# Patient Record
Sex: Female | Born: 1935 | Race: White | Hispanic: No | Smoking: Former smoker
Health system: Southern US, Community
[De-identification: ages and names within clinical notes are randomized; demographics above are authoritative.]

## PROBLEM LIST (undated history)

## (undated) DIAGNOSIS — I10 Essential (primary) hypertension: Secondary | ICD-10-CM

---

## 2006-09-03 ENCOUNTER — Encounter: Admission: RE | Admit: 2006-09-03 | Discharge: 2006-09-03 | Payer: Self-pay | Admitting: Neurosurgery

## 2006-11-25 ENCOUNTER — Encounter: Admission: RE | Admit: 2006-11-25 | Discharge: 2006-11-25 | Payer: Self-pay | Admitting: Orthopedic Surgery

## 2006-11-26 ENCOUNTER — Ambulatory Visit (HOSPITAL_BASED_OUTPATIENT_CLINIC_OR_DEPARTMENT_OTHER): Admission: RE | Admit: 2006-11-26 | Discharge: 2006-11-27 | Payer: Self-pay | Admitting: Orthopedic Surgery

## 2011-05-07 ENCOUNTER — Encounter (HOSPITAL_COMMUNITY)
Admission: RE | Admit: 2011-05-07 | Discharge: 2011-05-07 | Disposition: A | Discharge status: Home / Self Care | Payer: Medicare Other | Source: Ambulatory Visit | Attending: Neurological Surgery | Admitting: Neurological Surgery

## 2011-05-07 ENCOUNTER — Ambulatory Visit (HOSPITAL_COMMUNITY)
Admission: RE | Admit: 2011-05-07 | Discharge: 2011-05-07 | Disposition: A | Discharge status: Home / Self Care | Payer: Medicare Other | Source: Ambulatory Visit | Attending: Neurological Surgery | Admitting: Neurological Surgery

## 2011-05-07 ENCOUNTER — Other Ambulatory Visit (HOSPITAL_COMMUNITY): Payer: Self-pay | Admitting: Neurological Surgery

## 2011-05-07 DIAGNOSIS — Z01811 Encounter for preprocedural respiratory examination: Secondary | ICD-10-CM | POA: Insufficient documentation

## 2011-05-07 DIAGNOSIS — Q762 Congenital spondylolisthesis: Secondary | ICD-10-CM | POA: Insufficient documentation

## 2011-05-07 DIAGNOSIS — M4310 Spondylolisthesis, site unspecified: Secondary | ICD-10-CM

## 2011-05-07 DIAGNOSIS — Z01810 Encounter for preprocedural cardiovascular examination: Secondary | ICD-10-CM | POA: Insufficient documentation

## 2011-05-07 DIAGNOSIS — Z01812 Encounter for preprocedural laboratory examination: Secondary | ICD-10-CM | POA: Insufficient documentation

## 2011-05-07 LAB — URINALYSIS, ROUTINE W REFLEX MICROSCOPIC
Bilirubin Urine: NEGATIVE
Specific Gravity, Urine: 1.006 (ref 1.005–1.030)
pH: 6.5 (ref 5.0–8.0)

## 2011-05-07 LAB — BASIC METABOLIC PANEL
CO2: 32 mEq/L (ref 19–32)
Chloride: 102 mEq/L (ref 96–112)
Glucose, Bld: 101 mg/dL — ABNORMAL HIGH (ref 70–99)
Potassium: 5.1 mEq/L (ref 3.5–5.1)
Sodium: 140 mEq/L (ref 135–145)

## 2011-05-07 LAB — DIFFERENTIAL
Eosinophils Relative: 2 % (ref 0–5)
Lymphocytes Relative: 34 % (ref 12–46)
Monocytes Relative: 10 % (ref 3–12)
Neutro Abs: 5.2 10*3/uL (ref 1.7–7.7)
Neutrophils Relative %: 53 % (ref 43–77)

## 2011-05-07 LAB — CBC
Hemoglobin: 13.3 g/dL (ref 12.0–15.0)
MCHC: 33.7 g/dL (ref 30.0–36.0)
Platelets: 350 10*3/uL (ref 150–400)
RDW: 14.1 % (ref 11.5–15.5)
WBC: 9.8 10*3/uL (ref 4.0–10.5)

## 2011-05-07 LAB — URINE MICROSCOPIC-ADD ON

## 2011-05-07 LAB — SURGICAL PCR SCREEN: MRSA, PCR: NEGATIVE

## 2011-05-07 LAB — PROTIME-INR: INR: 1.02 (ref 0.00–1.49)

## 2011-05-07 LAB — TYPE AND SCREEN
ABO/RH(D): O POS
Antibody Screen: NEGATIVE

## 2011-05-16 ENCOUNTER — Inpatient Hospital Stay (HOSPITAL_COMMUNITY): Payer: Medicare Other

## 2011-05-16 ENCOUNTER — Inpatient Hospital Stay (HOSPITAL_COMMUNITY)
Admission: RE | Admit: 2011-05-16 | Discharge: 2011-05-18 | DRG: 460 | Disposition: A | Discharge status: Home / Self Care | Payer: Medicare Other | Source: Ambulatory Visit | Attending: Neurological Surgery | Admitting: Neurological Surgery

## 2011-05-16 DIAGNOSIS — Z87891 Personal history of nicotine dependence: Secondary | ICD-10-CM

## 2011-05-16 DIAGNOSIS — E669 Obesity, unspecified: Secondary | ICD-10-CM | POA: Diagnosis present

## 2011-05-16 DIAGNOSIS — K219 Gastro-esophageal reflux disease without esophagitis: Secondary | ICD-10-CM | POA: Diagnosis present

## 2011-05-16 DIAGNOSIS — Q762 Congenital spondylolisthesis: Secondary | ICD-10-CM

## 2011-05-16 DIAGNOSIS — I10 Essential (primary) hypertension: Secondary | ICD-10-CM | POA: Diagnosis present

## 2011-05-16 DIAGNOSIS — J4489 Other specified chronic obstructive pulmonary disease: Secondary | ICD-10-CM | POA: Diagnosis present

## 2011-05-16 DIAGNOSIS — J449 Chronic obstructive pulmonary disease, unspecified: Secondary | ICD-10-CM | POA: Diagnosis present

## 2011-05-16 DIAGNOSIS — M48061 Spinal stenosis, lumbar region without neurogenic claudication: Principal | ICD-10-CM | POA: Diagnosis present

## 2011-06-11 NOTE — Op Note (Signed)
NAMEGREGORY, DOWE NO.:  [id number]  MEDICAL RECORD NO.:  [id number]  LOCATION:  2899                         FACILITY:  [facility]  PHYSICIAN:  Tia Alert, MD     DATE OF BIRTH:  [birth year]  DATE OF PROCEDURE:  05/16/2011 DATE OF DISCHARGE:                              OPERATIVE REPORT   PREOPERATIVE DIAGNOSES: 1. Lumbar spinal stenosis. 2. Spondylolisthesis L4-5. 3. Severe facet arthropathy L4-5, L5-S1. 4. Back pain. 5. Leg pain.  POSTOPERATIVE DIAGNOSES: 1. Lumbar spinal stenosis. 2. Spondylolisthesis L4-5. 3. Severe facet arthropathy L4-5, L5-S1. 4. Back pain. 5. Leg pain.  PROCEDURES: 1. Decompressive lumbar laminectomy, hemi facetectomy, and bilateral     foraminotomies at L4-5 with undercutting of the inferior part of     the L3 lamina and partial laminectomy, and lateral recess     decompression at L3-4 for decompression of the L4 and the L5 nerve     roots to decompress severe spinal stenosis, requiring much more     work than would be required for simple exposure for a PLIF     procedure. 2. Posterior lumbar interbody fusion L4-5 utilizing 10 x 24-mm PEEK     interbody cage, packed with local autograft and morselized     allograft, and a 10-mm tangent interbody bone wedge. 3. Intertransverse arthrodesis, L4-5, L5-S1 utilizing local autograft     and morselized allograft. 4. Nonsegmental fixation of L4-5 utilizing the Globus pedicle screw     system.  SURGEON:  Tia Alert, MD.  ASSISTANT:  Donalee Citrin, MD.  ANESTHESIA:  General endotracheal.  COMPLICATIONS:  None apparent.  INDICATIONS FOR THE PROCEDURE:  Ms. Stander is a 77 year old female who presented with severe back and bilateral leg pain.  She undergone previous laminectomy at L4-5 on the right at the [facility]. She had an MRI which showed lateral recess stenosis at L3-L4 with severe central spinal stenosis at L4-5 from the ligamentum flavum, overgrowth of facet  hypertrophy, and broad-based disk bulge with a significant spondylolisthesis at that level that was mobile and flexion/extension. She also had severe facet arthropathy at L4-5, L5-S1.  I recommended decompressive laminectomies and instrumented fusion.  She understood the risks, benefits, and expected outcome and wished to proceed.  DESCRIPTION OF PROCEDURE:  The patient was taken to the operating room, and after induction of adequate generalized endotracheal anesthesia, she was rolled into prone position on chest rolls and all pressure points were padded.  Her lumbar region was prepped with DuraPrep and then draped in usual sterile fashion.  A 10 mL of local anesthesia was injected and a dorsal midline incision was made and carried down to the lumbosacral fascia.  The fascia was opened and the paraspinous musculature was taken down in subperiosteal fashion to expose L3-4, L4- 5, and L5-S1.  Intraoperative fluoroscopy confirmed my level.  I dissected out to expose the transverse processes of L4, L5, and the sacral ala.  I then removed the spinous process of L4 and performed a complete laminectomy, hemi facetectomy, and foraminotomies at L4-5 for decompression of the L4-L5 nerve roots.  I did much more work than would be  required for exposure for simple PLIF procedure in order to decompress her severe stenosis.  I did a partial laminectomy at L3-4 and undercut the lateral recess there to decompress the L4 nerve root up to the disk space level.  During the decompression, her yellow ligament was quite stuck to the edges of the dura.  I spent considerable time, teasing this away from the edges of the dura.  She had had a previous laminectomy at L4-L5 on the right.  I worked diligently through the scar tissue to expose L4-5 bilaterally.  At no time to their, proceed an unintended durotomy, but I did decompress the nerve roots distally into their respective foramina, again to address her leg  pain from her severe spinal stenosis.  I turned my attention to the nonsegmental fixation.  I localized the pedicle screw entry zones utilizing AP fluoroscopy and surface landmarks and lateral fluoroscopy.  I probed each pedicle with pedicle probe and tapped each pedicle with 5.5 tap and placed 6.5 x 45 mm pedicle screws into the L4 and the L5 pedicles bilaterally.  I decorticated the transverse processes of L4 and L5 and the sacral ala and placed a mixture of local autograft and morselized allograft out over these to perform intertransverse arthrodesis.  I then turned my attention to the PLIF.  I localized the disk space bilaterally.  I coagulated the epidural venous vasculature.  I cut these sharply and used sequential dilation to distract the disk space up to 10 mm.  I then used the rotating cutter and the cutting chisel bilaterally to prepare the endplates, performed a complete diskectomy.  The midline was prepared with Epstein curettes.  We used a 10 x 24 mm PEEK interbody cage, packed with local autograft and morselized allograft and tapped this into position on the left.  A tangent interbody bone wedge was used on the right.  The midline was packed with local autograft and morselized allograft.  We then placed lordotic rods into multiaxial screw heads of the pedicle screws and locked these in position with locking caps and anti-torque device.  We then irrigated with saline solution containing bacitracin, inspected our nerve roots once again palpated along them to assure adequate decompression, lined the dura with Gelfoam, checked our final construct with AP and lateral fluoroscopy, placed a medium Hemovac drain through a separate stab incision and closed the muscle and the fascia with 0 Vicryl, closed the subcutaneous and subcuticular tissue with 2-0 and 3-0 Vicryl, and closed the skin with Benzoin and Steri-Strips.  The drapes were removed. Sterile dressing was applied.  The  patient was awakened from general anesthesia and transferred to recovery room in stable condition.  At the end of procedure, all sponge, needle, and instrument counts were correct.     Tia Alert, MD     DSJ/MEDQ  D:  05/16/2011  T:  05/16/2011  Job:  [id number]  Electronically Signed by Marikay Alar MD on 06/11/2011 01:13:00 PM

## 2011-06-25 ENCOUNTER — Ambulatory Visit
Admission: RE | Admit: 2011-06-25 | Discharge: 2011-06-25 | Disposition: A | Discharge status: Home / Self Care | Payer: Medicare Other | Source: Ambulatory Visit | Attending: Neurological Surgery | Admitting: Neurological Surgery

## 2011-06-25 ENCOUNTER — Other Ambulatory Visit: Payer: Self-pay | Admitting: Neurological Surgery

## 2011-06-25 DIAGNOSIS — M48061 Spinal stenosis, lumbar region without neurogenic claudication: Secondary | ICD-10-CM

## 2011-06-25 DIAGNOSIS — M545 Low back pain, unspecified: Secondary | ICD-10-CM

## 2011-06-26 NOTE — Discharge Summary (Signed)
  NAME:  OTTO, CARAWAY NO.:  [id number]  MEDICAL RECORD NO.:  [id number]  LOCATION:  3011                         FACILITY:  [facility]  PHYSICIAN:  Tia Alert, MD     DATE OF BIRTH:  [birth year]  DATE OF ADMISSION:  05/16/2011 DATE OF DISCHARGE:  05/18/2011                              DISCHARGE SUMMARY   ADMITTING DIAGNOSIS:  Lumbar spondylosis with spinal stenosis and spondylolisthesis, L4-5.  PROCEDURE:  Posterior lumbar interbody fusion, L4-5.  HISTORY OF ILLNESS:  Ms. Jerez is a 77 year old female who presented with severe back pain and bilateral leg pain.  She had undergone previous laminectomy at L4-5 on the right.  [facility] which showed lateral recess stenosis at L3-4, severe spinal stenosis at L4-5 with ligamentum flavum overgrowth and facet hypertrophy.  She had spondylolisthesis at that level.  I recommended decompression instrumented fusion in hopes of improving her pain syndrome.  She understood the risks, benefits, and expected outcome and wished to proceed.  HOSPITAL COURSE:  The patient was admitted on May 16, 2011 and taken to operating room where she underwent a PLIF at L4-5.  The patient tolerated the procedure well, taken to recovery room and then to floor in stable condition.  For details of the operative procedure, please see the dictated operative note.  The patient's hospital course was routine. There were no complications.  She had physical and occupational therapy ordered.  She worked with it for 2 days.  She made great strides with them.  Her pain was well-controlled on oral pain medications.  She tolerated regular diet.  Her incision remained clean, dry, and intact. She had a Hemovac drain in place that was removed postop day #2.  She was discharged to home on that day with stable condition with plans to follow up in 2 weeks.  FINAL DIAGNOSIS:  Posterior lumbar interbody fusion,  L4-5.     Tia Alert, MD     DSJ/MEDQ  D:  06/11/2011  T:  06/11/2011  Job:  [id number]  Electronically Signed by Marikay Alar MD on 06/26/2011 08:53:41 AM

## 2011-08-27 ENCOUNTER — Ambulatory Visit
Admission: RE | Admit: 2011-08-27 | Discharge: 2011-08-27 | Disposition: A | Discharge status: Home / Self Care | Payer: Medicare Other | Source: Ambulatory Visit | Attending: Neurological Surgery | Admitting: Neurological Surgery

## 2011-08-27 ENCOUNTER — Other Ambulatory Visit: Payer: Self-pay | Admitting: Neurological Surgery

## 2011-08-27 DIAGNOSIS — M4310 Spondylolisthesis, site unspecified: Secondary | ICD-10-CM

## 2011-08-27 DIAGNOSIS — M48061 Spinal stenosis, lumbar region without neurogenic claudication: Secondary | ICD-10-CM

## 2011-08-27 DIAGNOSIS — M545 Low back pain: Secondary | ICD-10-CM

## 2013-07-23 IMAGING — RF DG LUMBAR SPINE 2-3V
1 series · 2 of 2 positions shown · non-contrast
Comparison: None.

CLINICAL DATA: 77-year-old female undergoing lumbar surgery.

LUMBAR SPINE - 2-3 VIEW

[Series 1: run · 2 of 2 slices shown]
[im 1/2]
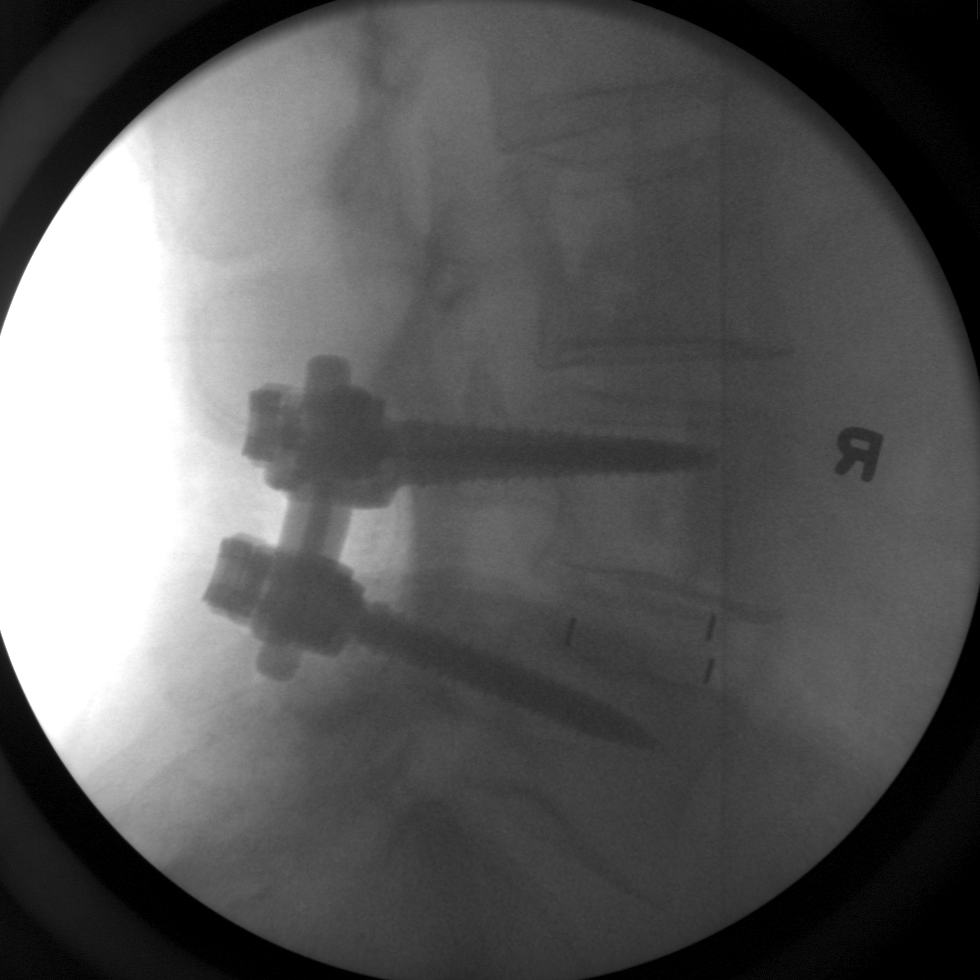
[im 2/2]
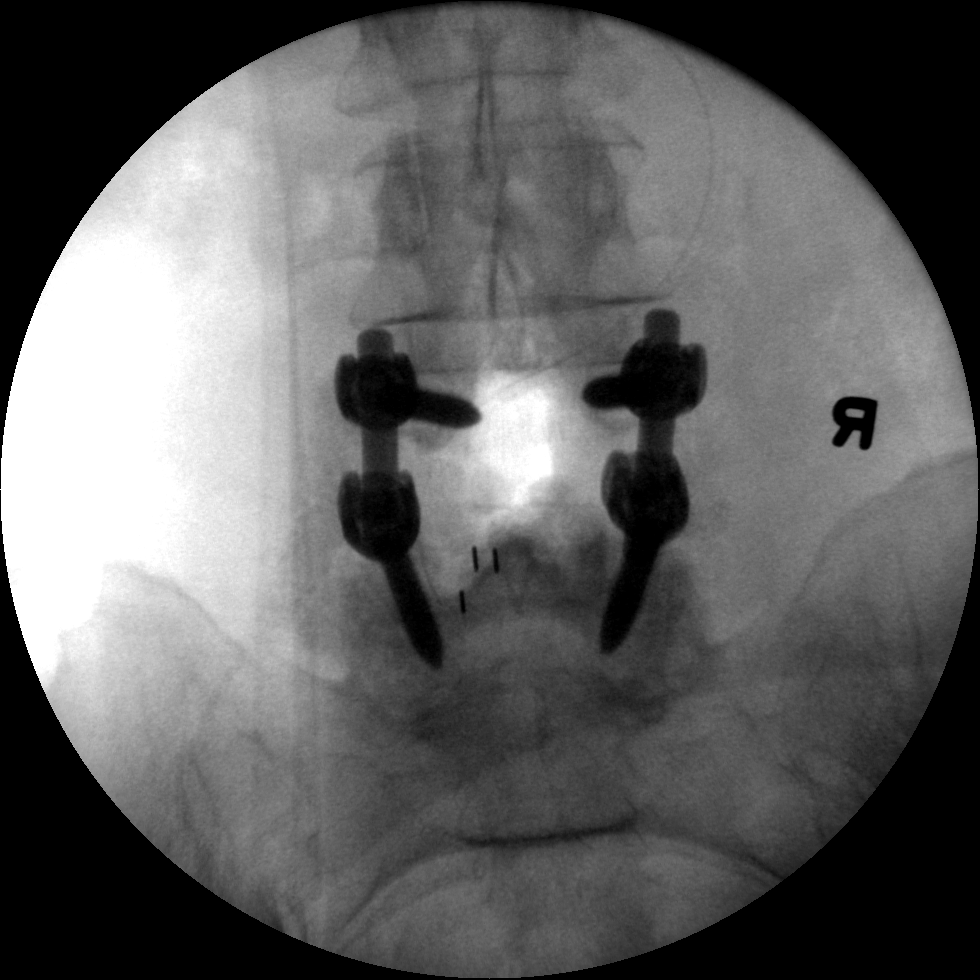

[2 of 2 positions shown; findings below may reference images not displayed]

FINDINGS: 2 intraoperative fluoroscopic spot views of the lower
lumbar spine.  Assuming normal lumbar segmentation, bilateral
transpedicular hardware is in placed at L4-L5.  There is an
intervening interbody implant.   Sequelae of decompression at this
level also noted.  Postoperative drain.
IMPRESSION: L4-L5 fusion and decompression depicted.

## 2015-05-28 DIAGNOSIS — K219 Gastro-esophageal reflux disease without esophagitis: Secondary | ICD-10-CM | POA: Insufficient documentation

## 2015-05-28 DIAGNOSIS — I10 Essential (primary) hypertension: Secondary | ICD-10-CM | POA: Insufficient documentation

## 2015-05-28 DIAGNOSIS — M797 Fibromyalgia: Secondary | ICD-10-CM | POA: Insufficient documentation

## 2015-11-16 DIAGNOSIS — E119 Type 2 diabetes mellitus without complications: Secondary | ICD-10-CM | POA: Insufficient documentation

## 2015-11-16 DIAGNOSIS — E785 Hyperlipidemia, unspecified: Secondary | ICD-10-CM | POA: Insufficient documentation

## 2015-12-21 DIAGNOSIS — H353131 Nonexudative age-related macular degeneration, bilateral, early dry stage: Secondary | ICD-10-CM | POA: Insufficient documentation

## 2018-07-29 DIAGNOSIS — M8580 Other specified disorders of bone density and structure, unspecified site: Secondary | ICD-10-CM | POA: Insufficient documentation

## 2020-11-08 DIAGNOSIS — I712 Thoracic aortic aneurysm, without rupture: Secondary | ICD-10-CM | POA: Insufficient documentation

## 2020-11-08 DIAGNOSIS — I7121 Aneurysm of the ascending aorta, without rupture: Secondary | ICD-10-CM | POA: Insufficient documentation

## 2021-02-08 DIAGNOSIS — I272 Pulmonary hypertension, unspecified: Secondary | ICD-10-CM | POA: Insufficient documentation

## 2021-02-08 DIAGNOSIS — I5030 Unspecified diastolic (congestive) heart failure: Secondary | ICD-10-CM | POA: Insufficient documentation

## 2021-04-12 ENCOUNTER — Encounter (HOSPITAL_BASED_OUTPATIENT_CLINIC_OR_DEPARTMENT_OTHER): Payer: Self-pay | Admitting: Emergency Medicine

## 2021-04-12 ENCOUNTER — Emergency Department (HOSPITAL_BASED_OUTPATIENT_CLINIC_OR_DEPARTMENT_OTHER)
Admission: EM | Admit: 2021-04-12 | Discharge: 2021-04-12 | Disposition: A | Discharge status: Home / Self Care | Payer: Medicare Other | Attending: Emergency Medicine | Admitting: Emergency Medicine

## 2021-04-12 ENCOUNTER — Other Ambulatory Visit (HOSPITAL_BASED_OUTPATIENT_CLINIC_OR_DEPARTMENT_OTHER): Payer: Self-pay | Admitting: Emergency Medicine

## 2021-04-12 ENCOUNTER — Other Ambulatory Visit: Payer: Self-pay

## 2021-04-12 DIAGNOSIS — Z7982 Long term (current) use of aspirin: Secondary | ICD-10-CM | POA: Diagnosis not present

## 2021-04-12 DIAGNOSIS — I110 Hypertensive heart disease with heart failure: Secondary | ICD-10-CM | POA: Insufficient documentation

## 2021-04-12 DIAGNOSIS — Z7984 Long term (current) use of oral hypoglycemic drugs: Secondary | ICD-10-CM | POA: Insufficient documentation

## 2021-04-12 DIAGNOSIS — K5900 Constipation, unspecified: Secondary | ICD-10-CM

## 2021-04-12 DIAGNOSIS — E119 Type 2 diabetes mellitus without complications: Secondary | ICD-10-CM | POA: Insufficient documentation

## 2021-04-12 DIAGNOSIS — Z79899 Other long term (current) drug therapy: Secondary | ICD-10-CM | POA: Insufficient documentation

## 2021-04-12 DIAGNOSIS — Z87891 Personal history of nicotine dependence: Secondary | ICD-10-CM | POA: Insufficient documentation

## 2021-04-12 DIAGNOSIS — Z96698 Presence of other orthopedic joint implants: Secondary | ICD-10-CM | POA: Insufficient documentation

## 2021-04-12 DIAGNOSIS — I10 Essential (primary) hypertension: Secondary | ICD-10-CM

## 2021-04-12 DIAGNOSIS — I5030 Unspecified diastolic (congestive) heart failure: Secondary | ICD-10-CM | POA: Diagnosis not present

## 2021-04-12 HISTORY — DX: Essential (primary) hypertension: I10

## 2021-04-12 MED ORDER — FLEET ENEMA 7-19 GM/118ML RE ENEM
1.0000 | ENEMA | Freq: Once | RECTAL | Status: AC
  Administered 2021-04-12: 1 via RECTAL
  Filled 2021-04-12: qty 1

## 2021-04-12 NOTE — Discharge Instructions (Addendum)
Eat a diet rich in fiber and drink plenty of water. Exercise 15-20 minutes per day if possible.  Bowel routine steps:  Step 1: Take 2 Senokot pills each day at bedtime. If no bowel movement after 2 days progress to step 2. Step 2: Take 2 Senokot pills in the morning and 2 in the evening. If no bowel movement in one day progress to step 3. Step 3: Take 3 Senokot pills and 1 capful of MiraLAX in the morning and in the evening. If no bowel movement after one day, start step 4. Step 4: Take 4 Senokot pills and one capful of MiraLAX in the morning, another capful of MiraLAX at lunch, and 4 Senokot pills and a capful of MiraLAX in the evening  *Talk to your doctor if you are still constipated after following these 4 steps. Don't take more than 8 Senokot pills a day. If loose stools develop, go back to the previous step. Do not stop the bowel routine completely.  

## 2021-04-12 NOTE — ED Provider Notes (Signed)
[facility] EMERGENCY DEPARTMENT Provider Note   CSN: [id number] Arrival date & time: 04/12/21  4166     History Chief Complaint  Patient presents with   Constipation    Dorothy Carney is a 87 y.o. female.  The history is provided by the patient.  Constipation Severity:  Severe Time since last bowel movement:  3 days Timing:  Constant Progression:  Worsening Chronicity:  New Context: dehydration   Context comment:  Has been taking Lasix Stool description:  None produced Unusual stool frequency:  Daily Relieved by:  Nothing Worsened by:  Nothing Ineffective treatments:  Laxatives (took one dulcolax) Associated symptoms: no abdominal pain, no back pain, no dysuria, no fever, no hematochezia, no nausea and no vomiting       Past Medical History:  Diagnosis Date   Hypertension     Patient Active Problem List   Diagnosis Date Noted   Diastolic CHF with preserved left ventricular function, NYHA class 2 (HCC) 02/08/2021   Pulmonary hypertension (HCC) 02/08/2021   Ascending aortic aneurysm (HCC) 11/08/2020   Osteopenia with high risk of fracture 07/29/2018   Nonexudative age-related macular degeneration, bilateral, early dry stage 12/21/2015   Controlled type 2 diabetes mellitus without complication, without long-term current use of insulin (HCC) 11/16/2015   Hyperlipidemia LDL goal <100 11/16/2015   Essential hypertension 05/28/2015   Fibromyalgia 05/28/2015   Gastroesophageal reflux disease without esophagitis 05/28/2015    Past Surgical History:  Procedure Laterality Date   ABDOMINAL HYSTERECTOMY     CHOLECYSTECTOMY     JOINT REPLACEMENT       OB History   No obstetric history on file.     History reviewed. No pertinent family history.  Social History   Tobacco Use   Smoking status: Former    Types: Cigarettes  Substance Use Topics   Alcohol use: Yes    Comment: occ   Drug use: Never    Home Medications Prior to Admission medications    Medication Sig Start Date End Date Taking? Authorizing Provider  albuterol (VENTOLIN HFA) 108 (90 Base) MCG/ACT inhaler Inhale into the lungs. 10/05/20   [provider]  alendronate (FOSAMAX) 70 MG tablet Take 70 mg by mouth once a week. 03/30/21   [provider]  aspirin 81 MG EC tablet Take by mouth.    [provider]  atorvastatin (LIPITOR) 20 MG tablet Take 1 tablet by mouth at bedtime. 06/06/15   [provider]  Calcium Carbonate-Vitamin D 600-200 MG-UNIT TABS Take 1 tablet by mouth daily.    [provider]  Cholecalciferol (VITAMIN D3) 50 MCG (2000 UT) CAPS Take by mouth.    [provider]  cyanocobalamin 1000 MCG tablet Take by mouth.    [provider]  DULoxetine (CYMBALTA) 30 MG capsule Take 30 mg by mouth daily. 02/15/21   [provider]  estradiol (ESTRACE) 0.1 MG/GM vaginal cream Place 1 g vaginally 3 (three) times a week. 01/28/21   [provider]  FEROSUL 325 (65 Fe) MG tablet Take 325 mg by mouth 2 (two) times daily. 03/24/21   [provider]  furosemide (LASIX) 20 MG tablet Take 20 mg by mouth daily. 03/28/21   [provider]  gabapentin (NEURONTIN) 300 MG capsule TAKE 2 CAPSULES BY MOUTH EVERY MORNING AND 3 CAPSULES AT BEDTIME (SEPARATE DOSEBY 12 HOURS) 03/23/21   [provider]  ipratropium (ATROVENT) 0.03 % nasal spray USE 2 SPRAYS IN EACH NOSTRIL 2-3 TIMES ADAY 07/14/16  [provider]  lisinopril (ZESTRIL) 10 MG tablet Take 10 mg by mouth daily. 03/15/21   [provider]  metFORMIN (GLUCOPHAGE) 500 MG tablet Take by mouth. 03/17/21   [provider]  Multiple Vitamins-Minerals (50+ ADULT EYE HEALTH) CAPS Take 1 capsule by mouth daily.    [provider]    Allergies    Patient has no allergy information on record.  Review of Systems   Review of Systems  Constitutional:  Negative for chills and fever.  HENT:  Negative for ear  pain and sore throat.   Eyes:  Negative for pain and visual disturbance.  Respiratory:  Negative for cough and shortness of breath.   Cardiovascular:  Negative for chest pain and palpitations.  Gastrointestinal:  Positive for constipation. Negative for abdominal pain, hematochezia, nausea and vomiting.  Genitourinary:  Negative for dysuria and hematuria.  Musculoskeletal:  Negative for arthralgias and back pain.  Skin:  Negative for color change and rash.  Neurological:  Negative for seizures and syncope.  All other systems reviewed and are negative.  Physical Exam Updated Vital Signs BP (!) 163/109 (BP Location: Right Arm)   Pulse 76   Temp 98.5 F (36.9 C) (Oral)   Resp 20   Ht 5\' 5"  (1.651 m)   Wt 73.9 kg   SpO2 93%   BMI 27.12 kg/m   Physical Exam Vitals and nursing note reviewed.  HENT:     Head: Normocephalic and atraumatic.  Eyes:     General: No scleral icterus. Pulmonary:     Effort: Pulmonary effort is normal. No respiratory distress.  Abdominal:     General: There is no distension.     Palpations: Abdomen is soft.     Tenderness: There is no abdominal tenderness. There is no guarding.  Genitourinary:    Comments: Soft stool in rectum Musculoskeletal:     Cervical back: Normal range of motion.  Skin:    General: Skin is warm and dry.  Neurological:     Mental Status: She is alert.  Psychiatric:        Mood and Affect: Mood normal.    ED Results / Procedures / Treatments   Labs (all labs ordered are listed, but only abnormal results are displayed) Labs Reviewed - No data to display  EKG None  Radiology No results found.  Procedures Procedures   Medications Ordered in ED Medications  sodium phosphate (FLEET) 7-19 GM/118ML enema 1 enema (has no administration in time range)    ED Course  I have reviewed the triage vital signs and the nursing notes.  Pertinent labs & imaging results that were available during my care of the patient were  reviewed by me and considered in my medical decision making (see chart for details).  Clinical Course as of 04/12/21 1011  Wed Apr 12, 2021  Apr 14, 2021 Manual disimpaction of large volume of stool performed.  [AW]    Clinical Course User Index [AW] 2536, MD   MDM Rules/Calculators/A&P                           Dorothy Carney presents with constipation.  She achieved relief with enema and disimpaction.  She was advised on a bowel regimen.  I also discussed her hypertension with her.  She was taken off her lisinopril secondary to a syncopal episode.  She agrees to follow-up with her doctor.  I did consider whether or not there  was any intra-abdominal pathology at the root of her symptoms.  However, her belly is soft, nontender, and not suggestive of serious intra-abdominal pathology.  Imaging was deferred. Final Clinical Impression(s) / ED Diagnoses Final diagnoses:  Constipation, unspecified constipation type  Primary hypertension    Rx / DC Orders ED Discharge Orders     None        Dorothy Distance, MD 04/12/21 1012

## 2021-04-12 NOTE — ED Triage Notes (Signed)
Pt arrives pov with son, c/o constipation and rectal pressure "for several days". Pt endorses lasix for a few months. Pt denies abdominal pain. Pt currently wearing heart monitor d/t past c/o dizziness
